# Patient Record
Sex: Female | Born: 1950 | Race: White | Hispanic: No | Marital: Married | Smoking: Former smoker
Health system: Southern US, Community
[De-identification: ages and names within clinical notes are randomized; demographics above are authoritative.]

## PROBLEM LIST (undated history)

## (undated) DIAGNOSIS — E785 Hyperlipidemia, unspecified: Secondary | ICD-10-CM

## (undated) DIAGNOSIS — B019 Varicella without complication: Secondary | ICD-10-CM

## (undated) DIAGNOSIS — Z8619 Personal history of other infectious and parasitic diseases: Secondary | ICD-10-CM

## (undated) HISTORY — DX: Varicella without complication: B01.9

## (undated) HISTORY — DX: Hyperlipidemia, unspecified: E78.5

## (undated) HISTORY — PX: BREAST EXCISIONAL BIOPSY: SUR124

---

## 1982-07-18 HISTORY — PX: OTHER SURGICAL HISTORY: SHX169

## 2015-04-10 ENCOUNTER — Other Ambulatory Visit: Payer: Self-pay | Admitting: Family Medicine

## 2015-04-10 DIAGNOSIS — R928 Other abnormal and inconclusive findings on diagnostic imaging of breast: Secondary | ICD-10-CM

## 2015-04-15 ENCOUNTER — Other Ambulatory Visit: Payer: Self-pay | Admitting: Family Medicine

## 2015-04-15 DIAGNOSIS — R928 Other abnormal and inconclusive findings on diagnostic imaging of breast: Secondary | ICD-10-CM

## 2015-06-08 ENCOUNTER — Ambulatory Visit
Admission: RE | Admit: 2015-06-08 | Discharge: 2015-06-08 | Disposition: A | Discharge status: Home / Self Care | Payer: BLUE CROSS/BLUE SHIELD | Source: Ambulatory Visit | Attending: Family Medicine | Admitting: Family Medicine

## 2015-06-08 DIAGNOSIS — R928 Other abnormal and inconclusive findings on diagnostic imaging of breast: Secondary | ICD-10-CM

## 2015-09-28 ENCOUNTER — Ambulatory Visit: Payer: BLUE CROSS/BLUE SHIELD | Admitting: Family Medicine

## 2015-10-05 ENCOUNTER — Ambulatory Visit (INDEPENDENT_AMBULATORY_CARE_PROVIDER_SITE_OTHER): Payer: BLUE CROSS/BLUE SHIELD | Admitting: Family Medicine

## 2015-10-05 NOTE — Progress Notes (Signed)
Pre visit review using our clinic review tool, if applicable. No additional management support is needed unless otherwise documented below in the visit note. 

## 2015-10-06 NOTE — Progress Notes (Signed)
Patient ID: Alicia CarneAlberta Hancock, female   DOB: 10/23/1950, 65 y.o.   MRN: [id number] Opened in error.

## 2015-11-30 ENCOUNTER — Ambulatory Visit (INDEPENDENT_AMBULATORY_CARE_PROVIDER_SITE_OTHER): Payer: BLUE CROSS/BLUE SHIELD | Admitting: Family Medicine

## 2015-11-30 ENCOUNTER — Encounter: Payer: Self-pay | Admitting: Family Medicine

## 2015-11-30 VITALS — BP 124/76 | HR 75 | Temp 98.1°F | Ht 64.0 in | Wt 131.0 lb

## 2015-11-30 DIAGNOSIS — Z0000 Encounter for general adult medical examination without abnormal findings: Secondary | ICD-10-CM | POA: Diagnosis not present

## 2015-11-30 DIAGNOSIS — Z1159 Encounter for screening for other viral diseases: Secondary | ICD-10-CM | POA: Diagnosis not present

## 2015-11-30 DIAGNOSIS — Z1239 Encounter for other screening for malignant neoplasm of breast: Secondary | ICD-10-CM

## 2015-11-30 DIAGNOSIS — Z13220 Encounter for screening for lipoid disorders: Secondary | ICD-10-CM

## 2015-11-30 DIAGNOSIS — Z1211 Encounter for screening for malignant neoplasm of colon: Secondary | ICD-10-CM | POA: Diagnosis not present

## 2015-11-30 DIAGNOSIS — Z79890 Hormone replacement therapy (postmenopausal): Secondary | ICD-10-CM

## 2015-11-30 LAB — COMPREHENSIVE METABOLIC PANEL
ALT: 16 U/L (ref 0–35)
AST: 18 U/L (ref 0–37)
Albumin: 4.4 g/dL (ref 3.5–5.2)
Alkaline Phosphatase: 55 U/L (ref 39–117)
BUN: 13 mg/dL (ref 6–23)
CALCIUM: 9.3 mg/dL (ref 8.4–10.5)
CHLORIDE: 105 meq/L (ref 96–112)
CO2: 28 meq/L (ref 19–32)
Creatinine, Ser: 0.65 mg/dL (ref 0.40–1.20)
GFR: 97.27 mL/min (ref >60.00)
Glucose, Bld: 94 mg/dL (ref 70–99)
POTASSIUM: 4.4 meq/L (ref 3.5–5.1)
Sodium: 140 mEq/L (ref 135–145)
Total Bilirubin: 0.5 mg/dL (ref 0.2–1.2)
Total Protein: 6.6 g/dL (ref 6.0–8.3)

## 2015-11-30 LAB — LIPID PANEL
CHOLESTEROL: 293 mg/dL — AB (ref 0–200)
HDL: 60.2 mg/dL (ref >39.00)
NonHDL: 232.57
TRIGLYCERIDES: 209 mg/dL — AB (ref 0.0–149.0)
Total CHOL/HDL Ratio: 5
VLDL: 41.8 mg/dL — ABNORMAL HIGH (ref 0.0–40.0)

## 2015-11-30 LAB — CBC
HCT: 35.9 % — ABNORMAL LOW (ref 36.0–46.0)
HEMOGLOBIN: 12.3 g/dL (ref 12.0–15.0)
MCHC: 34.4 g/dL (ref 30.0–36.0)
MCV: 89.4 fl (ref 78.0–100.0)
PLATELETS: 175 10*3/uL (ref 150.0–400.0)
RBC: 4.01 Mil/uL (ref 3.87–5.11)
RDW: 13 % (ref 11.5–15.5)
WBC: 3.6 10*3/uL — ABNORMAL LOW (ref 4.0–10.5)

## 2015-11-30 LAB — LDL CHOLESTEROL, DIRECT: LDL DIRECT: 179 mg/dL

## 2015-11-30 LAB — TSH: TSH: 1.88 u[IU]/mL (ref 0.35–4.50)

## 2015-11-30 MED ORDER — ESTRADIOL 1 MG PO TABS: 1.0000 mg | ORAL_TABLET | Freq: Every day | ORAL | Status: DC

## 2015-11-30 NOTE — Assessment & Plan Note (Signed)
Overall doing well. Discussed diet and exercise. Up-to-date on vaccinations. We will set up with GI for colonoscopy. Mammogram and ultrasound ordered. No need for Pap smear given hysterectomy with cervix removal for noncancerous reasons. We will screen for hepatitis C. Lab work as outlined below as well.

## 2015-11-30 NOTE — Progress Notes (Signed)
Patient ID: Alicia Hancock, female   DOB: 01-16-1951, 65 y.o.   MRN: [id number]  Tommi Rumps, MD Phone: [phone]  Alicia Hancock is a 65 y.o. female who presents today for physical exam.  Patient reports overall she is doing quite well. No chronic medical issues. Has had borderline cholesterol in the past. Exercises by golfing. She is also going to start doing some exercise at the gym. Diet consists of mostly of lean white meats and vegetables. Tdap 5 years ago. Zostavax was last year. Has never had a colonoscopy. Is due for a mammogram. Has been getting them every 6 months due to dense breasts and prior cyst removal. Last Pap smear was about 3 years ago. She is status post hysterectomy with removal of the cervix due to fibroids. Drinks a glass of wine every other day. No illicit drug use. No tobacco use. Has never had hepatitis C or HIV screening. She does take estradiol for for hormone replacement therapy. She denies hot flashes and vaginal dryness. No history of blood clots. She's been on this since she had her hysterectomy in the 80s.  Active Ambulatory Problems    Diagnosis Date Noted  . Routine general medical examination at a health care facility 11/30/2015  . Hormone replacement therapy 11/30/2015   Resolved Ambulatory Problems    Diagnosis Date Noted  . No Resolved Ambulatory Problems   Past Medical History  Diagnosis Date  . Chickenpox   . Hyperlipidemia     Family History  Problem Relation Age of Onset  . Prostate cancer    . Hyperlipidemia    . Stroke      Social History   Social History  . Marital Status: Married    Spouse Name: N/A  . Number of Children: N/A  . Years of Education: N/A   Occupational History  . Not on file.   Social History Main Topics  . Smoking status: Former Research scientist (life sciences)  . Smokeless tobacco: Not on file  . Alcohol Use: 1.8 oz/week    3 Standard drinks or equivalent per week     Comment: WINE  . Drug Use: No  . Sexual  Activity: Not on file   Other Topics Concern  . Not on file   Social History Narrative  . No narrative on file    ROS  General:  Negative for nexplained weight loss, fever Skin: Negative for new or changing mole, sore that won't heal HEENT: Negative for trouble hearing, trouble seeing, ringing in ears, mouth sores, hoarseness, change in voice, dysphagia. CV:  Negative for chest pain, dyspnea, edema, palpitations Resp: Negative for cough, dyspnea, hemoptysis GI: Negative for nausea, vomiting, diarrhea, constipation, abdominal pain, melena, hematochezia. GU: Negative for dysuria, incontinence, urinary hesitance, hematuria, vaginal or penile discharge, polyuria, sexual difficulty, lumps in testicle or breasts MSK: Negative for muscle cramps or aches, joint pain or swelling Neuro: Negative for headaches, weakness, numbness, dizziness, passing out/fainting Psych: Negative for depression, anxiety, memory problems  Objective  Physical Exam Filed Vitals:   11/30/15 0917  BP: 124/76  Pulse: 75  Temp: 98.1 F (36.7 C)    BP Readings from Last 3 Encounters:  11/30/15 124/76   Wt Readings from Last 3 Encounters:  11/30/15 131 lb (59.421 kg)    Physical Exam  Constitutional: She is well-developed, well-nourished, and in no distress.  HENT:  Head: Normocephalic and atraumatic.  Right Ear: External ear normal.  Left Ear: External ear normal.  Mouth/Throat: Oropharynx is clear and moist. No oropharyngeal  exudate.  Eyes: Conjunctivae are normal. Pupils are equal, round, and reactive to light.  Neck: Neck supple.  Cardiovascular: Normal rate, regular rhythm and normal heart sounds.  Exam reveals no gallop and no friction rub.   No murmur heard. Pulmonary/Chest: Effort normal and breath sounds normal.  Abdominal: Soft. Bowel sounds are normal. She exhibits no distension. There is no tenderness. There is no rebound and no guarding.  Musculoskeletal: She exhibits no edema.    Lymphadenopathy:    She has no cervical adenopathy.  Neurological: She is alert. Gait normal.  Skin: Skin is warm and dry. She is not diaphoretic.  Psychiatric: Mood and affect normal.     Assessment/Plan:   Routine general medical examination at a health care facility Overall doing well. Discussed diet and exercise. Up-to-date on vaccinations. We will set up with GI for colonoscopy. Mammogram and ultrasound ordered. No need for Pap smear given hysterectomy with cervix removal for noncancerous reasons. We will screen for hepatitis C. Lab work as outlined below as well.  Hormone replacement therapy Patient is surgically postmenopausal. No menopausal symptoms given that she is on hormone replacement. Discussed risks of this medication. She acknowledges these. Refill was given.    Orders Placed This Encounter  Procedures  . MM Digital Screening    Standing Status: Future     Number of Occurrences:      Standing Expiration Date: 01/29/2017    Order Specific Question:  Reason for Exam (SYMPTOM  OR DIAGNOSIS REQUIRED)    Answer:  breast cancer screening    Order Specific Question:  Preferred imaging location?    Answer:  [facility]  . US BREAST COMPLETE UNI RIGHT INC AXILLA    Standing Status: Future     Number of Occurrences:      Standing Expiration Date: 01/29/2017    Order Specific Question:  Reason for Exam (SYMPTOM  OR DIAGNOSIS REQUIRED)    Answer:  dense breasts, breast cancer screening    Order Specific Question:  Preferred imaging location?    Answer:  [facility]  . US BREAST COMPLETE UNI LEFT INC AXILLA    Standing Status: Future     Number of Occurrences:      Standing Expiration Date: 01/29/2017    Order Specific Question:  Reason for Exam (SYMPTOM  OR DIAGNOSIS REQUIRED)    Answer:  dense breasts, breast cancer screening    Order Specific Question:  Preferred imaging location?    Answer:  [facility]  . Lipid Profile  . CBC  . Comp Met (CMET)   . Hepatitis C Antibody  . TSH  . Ambulatory referral to Gastroenterology    Referral Priority:  Routine    Referral Type:  Consultation    Referral Reason:  Specialty Services Required    Number of Visits Requested:  1    Meds ordered this encounter  Medications  . DISCONTD: estradiol (ESTRACE) 1 MG tablet    Sig: Take 1 mg by mouth daily.  . estradiol (ESTRACE) 1 MG tablet    Sig: Take 1 tablet (1 mg total) by mouth daily.    Dispense:  90 tablet    Refill:  2     Eric Sonnenberg, MD [facility] [place]   

## 2015-11-30 NOTE — Progress Notes (Signed)
Pre visit review using our clinic review tool, if applicable. No additional management support is needed unless otherwise documented below in the visit note. 

## 2015-11-30 NOTE — Patient Instructions (Signed)
Nice to meet you. We'll check lab work today and call with the results. We will get you scheduled with GI for colonoscopy as well. Mammogram has been ordered and you will be contacted to get these scheduled. I refilled your estradiol. You can continue to take this. If you develop symptoms of menopause please let us know. Please note the risks of blood clot, stroke, and heart attack with estradiol.

## 2015-11-30 NOTE — Assessment & Plan Note (Signed)
Patient is surgically postmenopausal. No menopausal symptoms given that she is on hormone replacement. Discussed risks of this medication. She acknowledges these. Refill was given.

## 2015-12-01 ENCOUNTER — Other Ambulatory Visit: Payer: Self-pay | Admitting: Family Medicine

## 2015-12-01 ENCOUNTER — Telehealth: Payer: Self-pay | Admitting: *Deleted

## 2015-12-01 DIAGNOSIS — N63 Unspecified lump in unspecified breast: Secondary | ICD-10-CM

## 2015-12-01 LAB — HEPATITIS C ANTIBODY: HCV Ab: NEGATIVE

## 2015-12-01 NOTE — Telephone Encounter (Signed)
LM for patient to return call.

## 2015-12-01 NOTE — Telephone Encounter (Signed)
Patient returned a call in regards to her lab results She will be at [phone]

## 2015-12-02 ENCOUNTER — Other Ambulatory Visit: Payer: Self-pay | Admitting: Family Medicine

## 2015-12-02 DIAGNOSIS — D72819 Decreased white blood cell count, unspecified: Secondary | ICD-10-CM

## 2015-12-02 NOTE — Telephone Encounter (Signed)
Notified patient of lab results 

## 2015-12-03 ENCOUNTER — Telehealth: Payer: Self-pay | Admitting: Family Medicine

## 2015-12-03 NOTE — Telephone Encounter (Signed)
Ok. Thank you.

## 2015-12-03 NOTE — Telephone Encounter (Signed)
Pt called back with a question regarding her lab result. Call pt @ [phone]. Thank you!

## 2015-12-03 NOTE — Telephone Encounter (Signed)
Reviewed labs again with her and scheduled f/u labs in June. thanks

## 2015-12-17 ENCOUNTER — Ambulatory Visit
Admission: RE | Admit: 2015-12-17 | Discharge: 2015-12-17 | Disposition: A | Discharge status: Home / Self Care | Payer: BLUE CROSS/BLUE SHIELD | Source: Ambulatory Visit | Attending: Family Medicine | Admitting: Family Medicine

## 2015-12-17 DIAGNOSIS — N63 Unspecified lump in unspecified breast: Secondary | ICD-10-CM

## 2015-12-31 ENCOUNTER — Other Ambulatory Visit (INDEPENDENT_AMBULATORY_CARE_PROVIDER_SITE_OTHER): Payer: BLUE CROSS/BLUE SHIELD

## 2015-12-31 ENCOUNTER — Encounter: Payer: Self-pay | Admitting: Family Medicine

## 2015-12-31 DIAGNOSIS — D72819 Decreased white blood cell count, unspecified: Secondary | ICD-10-CM

## 2015-12-31 LAB — CBC WITH DIFFERENTIAL/PLATELET
BASOS ABS: 0 10*3/uL (ref 0.0–0.1)
BASOS PCT: 0.6 % (ref 0.0–3.0)
EOS ABS: 0.1 10*3/uL (ref 0.0–0.7)
Eosinophils Relative: 1.6 % (ref 0.0–5.0)
HEMATOCRIT: 37.2 % (ref 36.0–46.0)
Hemoglobin: 12.6 g/dL (ref 12.0–15.0)
LYMPHS ABS: 1.4 10*3/uL (ref 0.7–4.0)
LYMPHS PCT: 33.7 % (ref 12.0–46.0)
MCHC: 33.9 g/dL (ref 30.0–36.0)
MCV: 90.4 fl (ref 78.0–100.0)
Monocytes Absolute: 0.3 10*3/uL (ref 0.1–1.0)
Monocytes Relative: 6.5 % (ref 3.0–12.0)
NEUTROS ABS: 2.4 10*3/uL (ref 1.4–7.7)
NEUTROS PCT: 57.6 % (ref 43.0–77.0)
PLATELETS: 283 10*3/uL (ref 150.0–400.0)
RBC: 4.11 Mil/uL (ref 3.87–5.11)
RDW: 12.8 % (ref 11.5–15.5)
WBC: 4.2 10*3/uL (ref 4.0–10.5)

## 2016-01-04 ENCOUNTER — Other Ambulatory Visit: Payer: BLUE CROSS/BLUE SHIELD

## 2016-03-17 ENCOUNTER — Encounter: Payer: Self-pay | Admitting: *Deleted

## 2016-03-18 ENCOUNTER — Encounter
Admission: RE | Disposition: A | Discharge status: Home / Self Care | Payer: Self-pay | Source: Ambulatory Visit | Attending: Gastroenterology

## 2016-03-18 ENCOUNTER — Ambulatory Visit: Payer: Medicare Other | Admitting: Anesthesiology

## 2016-03-18 ENCOUNTER — Ambulatory Visit
Admission: RE | Admit: 2016-03-18 | Discharge: 2016-03-18 | Disposition: A | Discharge status: Home / Self Care | Payer: Medicare Other | Source: Ambulatory Visit | Attending: Gastroenterology | Admitting: Gastroenterology

## 2016-03-18 ENCOUNTER — Encounter: Payer: Self-pay | Admitting: *Deleted

## 2016-03-18 DIAGNOSIS — K644 Residual hemorrhoidal skin tags: Secondary | ICD-10-CM | POA: Insufficient documentation

## 2016-03-18 DIAGNOSIS — Z1211 Encounter for screening for malignant neoplasm of colon: Secondary | ICD-10-CM | POA: Diagnosis present

## 2016-03-18 DIAGNOSIS — E785 Hyperlipidemia, unspecified: Secondary | ICD-10-CM | POA: Insufficient documentation

## 2016-03-18 DIAGNOSIS — Q438 Other specified congenital malformations of intestine: Secondary | ICD-10-CM | POA: Diagnosis not present

## 2016-03-18 HISTORY — DX: Personal history of other infectious and parasitic diseases: Z86.19

## 2016-03-18 HISTORY — PX: COLONOSCOPY WITH PROPOFOL: SHX5780

## 2016-03-18 SURGERY — COLONOSCOPY WITH PROPOFOL
Anesthesia: General

## 2016-03-18 MED ORDER — SODIUM CHLORIDE 0.9 % IV SOLN
INTRAVENOUS | Status: DC
  Administered 2016-03-18: 13:00:00 via INTRAVENOUS

## 2016-03-18 MED ORDER — PROPOFOL 500 MG/50ML IV EMUL
INTRAVENOUS | Status: DC | PRN
  Administered 2016-03-18: 150 ug/kg/min via INTRAVENOUS

## 2016-03-18 MED ORDER — SODIUM CHLORIDE 0.9 % IV SOLN
INTRAVENOUS | Status: DC
  Administered 2016-03-18: 1000 mL via INTRAVENOUS

## 2016-03-18 MED ORDER — PROPOFOL 10 MG/ML IV BOLUS
INTRAVENOUS | Status: DC | PRN
  Administered 2016-03-18: 60 mg via INTRAVENOUS

## 2016-03-18 MED ORDER — LIDOCAINE HCL (CARDIAC) 20 MG/ML IV SOLN
INTRAVENOUS | Status: DC | PRN
  Administered 2016-03-18: 60 mg via INTRAVENOUS

## 2016-03-18 MED ORDER — PHENYLEPHRINE HCL 10 MG/ML IJ SOLN
INTRAMUSCULAR | Status: DC | PRN
  Administered 2016-03-18 (×2): 200 ug via INTRAVENOUS

## 2016-03-18 NOTE — Anesthesia Postprocedure Evaluation (Signed)
Anesthesia Post Note  Patient: Alicia Hancock  Procedure(s) Performed: Procedure(s) (LRB): COLONOSCOPY WITH PROPOFOL (N/A)  Patient location during evaluation: PACU Anesthesia Type: General Level of consciousness: awake and alert and oriented Pain management: pain level controlled Vital Signs Assessment: post-procedure vital signs reviewed and stable Respiratory status: spontaneous breathing, nonlabored ventilation and respiratory function stable Cardiovascular status: blood pressure returned to baseline and stable Postop Assessment: no signs of nausea or vomiting Anesthetic complications: no    Last Vitals:  Vitals:   03/18/16 1247 03/18/16 1420  BP: 130/76   Pulse: 93   Resp: 16   Temp: 36.6 C (!) 35.9 C    Last Pain:  Vitals:   03/18/16 1420  TempSrc: Tympanic                 Gabriana Wilmott

## 2016-03-18 NOTE — Op Note (Signed)
[facility] Patient Name: Alicia Hancock Procedure Date: 03/18/2016 1:19 PM MRN: [id number] Account #: [id number] Date of Birth: 1950/11/01 Admit Type: Outpatient Age: 65 Room: [facility] ENDO ROOM 3 Gender: Female Note Status: Finalized Procedure:            Colonoscopy Indications:          Screening for colorectal malignant neoplasm, This is                        the patient's first colonoscopy Providers:            Christena Deem, MD Referring MD:         Minerva Areola g. Birdie Sons (Referring MD) Medicines:            Monitored Anesthesia Care Complications:        No immediate complications. Procedure:            Pre-Anesthesia Assessment:                       - ASA Grade Assessment: II - A patient with mild                        systemic disease.                       After obtaining informed consent, the colonoscope was                        passed under direct vision. Throughout the procedure,                        the patient's blood pressure, pulse, and oxygen                        saturations were monitored continuously. The                        Colonoscope was introduced through the anus and                        advanced to the the cecum, identified by appendiceal                        orifice and ileocecal valve. The patient tolerated the                        procedure well. The quality of the bowel preparation                        was good. The colonoscopy was unusually difficult due                        to significant looping and a tortuous colon. Successful                        completion of the procedure was aided by changing the                        patient to a supine position, using manual pressure and  changing endoscopes. Findings:      The sigmoid colon, descending colon and transverse colon were       significantly redundant.      Non-bleeding external hemorrhoids were found during  perianal exam. The       hemorrhoids were small.      The digital rectal exam was normal. Impression:           - Redundant colon.                       - Non-bleeding external hemorrhoids.                       - No specimens collected. Recommendation:       - Discharge patient to home.                       - Use Analpram HC Cream 2.5%: Apply externally TID for                        10 days. Procedure Code(s):    --- Professional ---                       [phone], Colonoscopy, flexible; diagnostic, including                        collection of specimen(s) by brushing or washing, when                        performed (separate procedure) Diagnosis Code(s):    --- Professional ---                       Z12.11, Encounter for screening for malignant neoplasm                        of colon                       K64.4, Residual hemorrhoidal skin tags                       Q43.8, Other specified congenital malformations of                        intestine CPT copyright 2016 American Medical Association. All rights reserved. The codes documented in this report are preliminary and upon coder review may  be revised to meet current compliance requirements. Christena DeemMartin U Skulskie, MD 03/18/2016 2:19:32 PM This report has been signed electronically. Number of Addenda: 0 Note Initiated On: 03/18/2016 1:19 PM Scope Withdrawal Time: 0 hours 6 minutes 15 seconds  Total Procedure Duration: 0 hours 43 minutes 11 seconds       [facility]

## 2016-03-18 NOTE — Transfer of Care (Signed)
Immediate Anesthesia Transfer of Care Note  Patient: Alicia Hancock  Procedure(s) Performed: Procedure(s): COLONOSCOPY WITH PROPOFOL (N/A)  Patient Location: Endoscopy Unit  Anesthesia Type:General  Level of Consciousness: awake, alert , oriented and patient cooperative  Airway & Oxygen Therapy: Patient Spontanous Breathing and Patient connected to nasal cannula oxygen  Post-op Assessment: Report given to RN, Post -op Vital signs reviewed and stable and Patient moving all extremities X 4  Post vital signs: Reviewed and stable  Last Vitals:  Vitals:   03/18/16 1247  BP: 130/76  Pulse: 93  Resp: 16  Temp: 36.6 C    Last Pain:  Vitals:   03/18/16 1247  TempSrc: Tympanic         Complications: No apparent anesthesia complications

## 2016-03-18 NOTE — H&P (Signed)
Outpatient short stay form Pre-procedure 03/18/2016 1:18 PM Alicia Hancock Shriyan Arakawa MD  Primary Physician: Dr. Marikay AlarEric Sonnenberg  Reason for visit:  Screening colonoscopy  History of present illness:  Patient is a 65 year old female presenting today as above. She tolerated her prep well. She takes no aspirin or blood thinning agents.    Current Facility-Administered Medications:  .  0.9 %  sodium chloride infusion, , Intravenous, Continuous, Alicia Hancock Taisha Pennebaker, MD .  0.9 %  sodium chloride infusion, , Intravenous, Continuous, Alicia Hancock Wayland Baik, MD  Prescriptions Prior to Admission  Medication Sig Dispense Refill Last Dose  . estradiol (ESTRACE) 1 MG tablet Take 1 tablet (1 mg total) by mouth daily. 90 tablet 2 Past Week at Unknown time  . Multiple Vitamin (MULTIVITAMIN) tablet Take 1 tablet by mouth daily.   Past Week at Unknown time     No Known Allergies   Past Medical History:  Diagnosis Date  . Chickenpox   . History of chickenpox   . Hyperlipidemia     Review of systems:      Physical Exam    Heart and lungs: Regular rate and rhythm without rub or gallop, lungs are bilaterally clear.    HEENT: Normocephalic atraumatic eyes are anicteric    Other:     Pertinant exam for procedure: Soft nontender nondistended sounds positive normoactive.    Planned proceedures: Colonoscopy and indicated procedures. I have discussed the risks benefits and complications of procedures to include not limited to bleeding, infection, perforation and the risk of sedation and the patient wishes to proceed.    Alicia Hancock Germany Dodgen, MD Gastroenterology 03/18/2016  1:18 PM

## 2016-03-18 NOTE — Anesthesia Preprocedure Evaluation (Signed)
Anesthesia Evaluation  Patient identified by MRN, date of birth, ID band Patient awake    Reviewed: Allergy & Precautions, NPO status , Patient's Chart, lab work & pertinent test results  History of Anesthesia Complications Negative for: history of anesthetic complications  Airway Mallampati: II  TM Distance: >3 FB Neck ROM: Full    Dental no notable dental hx.    Pulmonary neg sleep apnea, neg COPD, former smoker,    breath sounds clear to auscultation- rhonchi (-) wheezing      Cardiovascular Exercise Tolerance: Good (-) hypertension(-) CAD and (-) Past MI  Rhythm:Regular Rate:Normal - Systolic murmurs and - Diastolic murmurs    Neuro/Psych negative neurological ROS  negative psych ROS   GI/Hepatic negative GI ROS, Neg liver ROS,   Endo/Other  negative endocrine ROSneg diabetes  Renal/GU negative Renal ROS     Musculoskeletal negative musculoskeletal ROS (+)   Abdominal (+) - obese,   Peds  Hematology negative hematology ROS (+)   Anesthesia Other Findings Past Medical History: No date: Chickenpox No date: History of chickenpox No date: Hyperlipidemia   Reproductive/Obstetrics                             Anesthesia Physical Anesthesia Plan  ASA: I  Anesthesia Plan: General   Post-op Pain Management:    Induction: Intravenous  Airway Management Planned: Natural Airway  Additional Equipment:   Intra-op Plan:   Post-operative Plan:   Informed Consent: I have reviewed the patients History and Physical, chart, labs and discussed the procedure including the risks, benefits and alternatives for the proposed anesthesia with the patient or authorized representative who has indicated his/her understanding and acceptance.   Dental advisory given  Plan Discussed with: Anesthesiologist and CRNA  Anesthesia Plan Comments:         Anesthesia Quick Evaluation

## 2016-03-22 ENCOUNTER — Encounter: Payer: Self-pay | Admitting: Gastroenterology

## 2016-09-02 ENCOUNTER — Other Ambulatory Visit: Payer: Self-pay | Admitting: Family Medicine

## 2016-09-02 NOTE — Telephone Encounter (Signed)
Last OV 11/30/15 last filled 11/30/15 90 2rf

## 2016-11-10 ENCOUNTER — Other Ambulatory Visit: Payer: Self-pay | Admitting: Family Medicine

## 2016-11-10 DIAGNOSIS — Z1231 Encounter for screening mammogram for malignant neoplasm of breast: Secondary | ICD-10-CM

## 2016-11-30 ENCOUNTER — Encounter: Payer: Self-pay | Admitting: Family Medicine

## 2016-11-30 ENCOUNTER — Ambulatory Visit (INDEPENDENT_AMBULATORY_CARE_PROVIDER_SITE_OTHER): Payer: Medicare Other | Admitting: Family Medicine

## 2016-11-30 VITALS — BP 120/66 | HR 73 | Temp 98.1°F | Ht 64.0 in | Wt 130.0 lb

## 2016-11-30 DIAGNOSIS — Z130 Encounter for screening for diseases of the blood and blood-forming organs and certain disorders involving the immune mechanism: Secondary | ICD-10-CM

## 2016-11-30 DIAGNOSIS — Z13220 Encounter for screening for lipoid disorders: Secondary | ICD-10-CM

## 2016-11-30 DIAGNOSIS — E785 Hyperlipidemia, unspecified: Secondary | ICD-10-CM | POA: Diagnosis not present

## 2016-11-30 DIAGNOSIS — Z1329 Encounter for screening for other suspected endocrine disorder: Secondary | ICD-10-CM

## 2016-11-30 DIAGNOSIS — D72819 Decreased white blood cell count, unspecified: Secondary | ICD-10-CM

## 2016-11-30 DIAGNOSIS — Z79890 Hormone replacement therapy (postmenopausal): Secondary | ICD-10-CM | POA: Diagnosis not present

## 2016-11-30 DIAGNOSIS — Z0000 Encounter for general adult medical examination without abnormal findings: Secondary | ICD-10-CM

## 2016-11-30 DIAGNOSIS — Z780 Asymptomatic menopausal state: Secondary | ICD-10-CM

## 2016-11-30 LAB — LIPID PANEL
Cholesterol: 286 mg/dL — ABNORMAL HIGH (ref 0–200)
HDL: 78 mg/dL (ref >39.00)
NONHDL: 207.61
Total CHOL/HDL Ratio: 4
Triglycerides: 213 mg/dL — ABNORMAL HIGH (ref 0.0–149.0)
VLDL: 42.6 mg/dL — ABNORMAL HIGH (ref 0.0–40.0)

## 2016-11-30 LAB — COMPREHENSIVE METABOLIC PANEL
ALBUMIN: 4.4 g/dL (ref 3.5–5.2)
ALT: 11 U/L (ref 0–35)
AST: 15 U/L (ref 0–37)
Alkaline Phosphatase: 52 U/L (ref 39–117)
BUN: 14 mg/dL (ref 6–23)
CHLORIDE: 104 meq/L (ref 96–112)
CO2: 29 mEq/L (ref 19–32)
CREATININE: 0.69 mg/dL (ref 0.40–1.20)
Calcium: 9.2 mg/dL (ref 8.4–10.5)
GFR: 90.51 mL/min (ref >60.00)
GLUCOSE: 97 mg/dL (ref 70–99)
Potassium: 4.2 mEq/L (ref 3.5–5.1)
SODIUM: 138 meq/L (ref 135–145)
Total Bilirubin: 0.4 mg/dL (ref 0.2–1.2)
Total Protein: 6.9 g/dL (ref 6.0–8.3)

## 2016-11-30 LAB — CBC
HEMATOCRIT: 39.5 % (ref 36.0–46.0)
HEMOGLOBIN: 13.5 g/dL (ref 12.0–15.0)
MCHC: 34.2 g/dL (ref 30.0–36.0)
MCV: 90.8 fl (ref 78.0–100.0)
Platelets: 266 10*3/uL (ref 150.0–400.0)
RBC: 4.35 Mil/uL (ref 3.87–5.11)
RDW: 12.4 % (ref 11.5–15.5)
WBC: 4.8 10*3/uL (ref 4.0–10.5)

## 2016-11-30 LAB — LDL CHOLESTEROL, DIRECT: LDL DIRECT: 167 mg/dL

## 2016-11-30 MED ORDER — PNEUMOCOCCAL 13-VAL CONJ VACC IM SUSP: 0.5000 mL | INTRAMUSCULAR | 0 refills | Status: AC

## 2016-11-30 MED ORDER — ESTRADIOL 1 MG PO TABS: 1.0000 mg | ORAL_TABLET | Freq: Every day | ORAL | 1 refills | Status: DC

## 2016-11-30 MED ORDER — TETANUS-DIPHTH-ACELL PERTUSSIS 5-2.5-18.5 LF-MCG/0.5 IM SUSP: 0.5000 mL | Freq: Once | INTRAMUSCULAR | 0 refills | Status: AC

## 2016-11-30 NOTE — Patient Instructions (Signed)
Nice to see you. Please continue to work on diet and exercise to help with your cholesterol. Please have the mammogram done as planned. Please get the pneumonia vaccine and a tetanus vaccination. We will contact you with your lab work. We will get a bone density test set up as well.

## 2016-11-30 NOTE — Assessment & Plan Note (Signed)
Physical exam completed. Encouraged continued diet and exercise. We'll check labs as outlined below. Mammogram rescheduled. Up-to-date on colonoscopy. Pap smear or pelvic exam deferred given prior history of hysterectomy and BSO. She was provided with Prevnar and tetanus vaccination prescriptions. DEXA scan was ordered. Lipid panel checked given prior elevated cholesterol. She does note she would be hesitant to start on medication for this.

## 2016-11-30 NOTE — Assessment & Plan Note (Signed)
Surgically postmenopausal. Has been on hormone replacement for some time. Discussed risks of this medication. She acknowledges these. Refills given.

## 2016-11-30 NOTE — Progress Notes (Signed)
Tommi Rumps, MD Phone: [phone]  Alicia Hancock is a 66 y.o. female who presents today for physical exam.  Exercises by walking while playing golf. Tries to moderate what she eats. She does eat anything she wants. Mostly drinks water. Due for mammogram next month. Colonoscopy up-to-date. History of hysterectomy and BSO for noncancerous reasons. No Pap smear needed. Due for pneumonia vaccine and tetanus vaccination. Due for DEXA scan. One alcoholic beverage daily. Quit smoking 36 years ago. No illicit drug use. Does have hyperlipidemia. This is familial. Has declined medication previously. No chest pain or shortness of breath. She does take hormone supplementation given that she had her ovaries removed many years ago. She notes no postmenopausal symptoms. She does not want to discontinue this.   Active Ambulatory Problems    Diagnosis Date Noted  . Routine general medical examination at a health care facility 11/30/2015  . Hormone replacement therapy 11/30/2015   Resolved Ambulatory Problems    Diagnosis Date Noted  . No Resolved Ambulatory Problems   Past Medical History:  Diagnosis Date  . Chickenpox   . History of chickenpox   . Hyperlipidemia     Family History  Problem Relation Age of Onset  . Prostate cancer Unknown   . Hyperlipidemia Unknown   . Stroke Unknown     Social History   Social History  . Marital status: Married    Spouse name: N/A  . Number of children: N/A  . Years of education: N/A   Occupational History  . Not on file.   Social History Main Topics  . Smoking status: Former Research scientist (life sciences)  . Smokeless tobacco: Never Used  . Alcohol use 1.8 oz/week    3 Standard drinks or equivalent per week     Comment: WINE  . Drug use: No  . Sexual activity: Not on file   Other Topics Concern  . Not on file   Social History Narrative  . No narrative on file    ROS  General:  Negative for nexplained weight loss, fever Skin: Negative for new  or changing mole, sore that won't heal HEENT: Negative for trouble hearing, trouble seeing, ringing in ears, mouth sores, hoarseness, change in voice, dysphagia. CV:  Negative for chest pain, dyspnea, edema, palpitations Resp: Negative for cough, dyspnea, hemoptysis GI: Negative for nausea, vomiting, diarrhea, constipation, abdominal pain, melena, hematochezia. GU: Negative for dysuria, incontinence, urinary hesitance, hematuria, vaginal or penile discharge, polyuria, sexual difficulty, lumps in testicle or breasts MSK: Negative for muscle cramps or aches, joint pain or swelling Neuro: Negative for headaches, weakness, numbness, dizziness, passing out/fainting Psych: Negative for depression, anxiety, memory problems  Objective  Physical Exam Vitals:   11/30/16 0959  BP: 120/66  Pulse: 73  Temp: 98.1 F (36.7 C)    BP Readings from Last 3 Encounters:  11/30/16 120/66  03/18/16 130/76  11/30/15 124/76   Wt Readings from Last 3 Encounters:  11/30/16 130 lb (59 kg)  03/18/16 128 lb (58.1 kg)  11/30/15 131 lb (59.4 kg)    Physical Exam  Constitutional: No distress.  HENT:  Head: Normocephalic and atraumatic.  Mouth/Throat: Oropharynx is clear and moist. No oropharyngeal exudate.  Eyes: Conjunctivae are normal. Pupils are equal, round, and reactive to light.  Cardiovascular: Normal rate, regular rhythm and normal heart sounds.   Pulmonary/Chest: Effort normal and breath sounds normal.  Abdominal: Soft. Bowel sounds are normal. She exhibits no distension. There is no tenderness. There is no rebound and no guarding.  Genitourinary:  Genitourinary Comments: Pelvic exam deferred given she is status post hysterectomy, breast exam with no masses, skin changes, or nipple inversion, no axillary masses bilaterally  Musculoskeletal: She exhibits no edema.  Neurological: She is alert. Gait normal.  Skin: Skin is warm and dry. She is not diaphoretic.  Psychiatric: Mood and affect normal.       Assessment/Plan:   Routine general medical examination at a health care facility Physical exam completed. Encouraged continued diet and exercise. We'll check labs as outlined below. Mammogram rescheduled. Up-to-date on colonoscopy. Pap smear or pelvic exam deferred given prior history of hysterectomy and BSO. She was provided with Prevnar and tetanus vaccination prescriptions. DEXA scan was ordered. Lipid panel checked given prior elevated cholesterol. She does note she would be hesitant to start on medication for this.  Hormone replacement therapy Surgically postmenopausal. Has been on hormone replacement for some time. Discussed risks of this medication. She acknowledges these. Refills given.   Orders Placed This Encounter  Procedures  . DG Bone Density    Standing Status:   Future    Standing Expiration Date:   01/30/2018    Order Specific Question:   Reason for Exam (SYMPTOM  OR DIAGNOSIS REQUIRED)    Answer:   post menopausal hormone deficiency, osteoporosis screening    Order Specific Question:   Preferred imaging location?    Answer:   [facility]  . Lipid panel  . Comp Met (CMET)  . CBC    Meds ordered this encounter  Medications  . estradiol (ESTRACE) 1 MG tablet    Sig: Take 1 tablet (1 mg total) by mouth daily.    Dispense:  90 tablet    Refill:  1  . pneumococcal 13-valent conjugate vaccine (PREVNAR 13) SUSP injection    Sig: Inject 0.5 mLs into the muscle tomorrow at 10 am.    Dispense:  1 Syringe    Refill:  0  . Tdap (BOOSTRIX) 5-2.5-18.5 LF-MCG/0.5 injection    Sig: Inject 0.5 mLs into the muscle once.    Dispense:  0.5 mL    Refill:  0     Tommi Rumps, MD [employer]

## 2016-12-01 ENCOUNTER — Telehealth: Payer: Self-pay

## 2016-12-01 NOTE — Telephone Encounter (Signed)
Patient notified and will think about starting cholesterol medication

## 2016-12-01 NOTE — Telephone Encounter (Signed)
-----   Message from Glori LuisEric G Sonnenberg, MD sent at 11/30/2016  4:38 PM EDT ----- Please let the patient know that her cholesterol is still quite elevated. She would benefit from medication such as crestor for this. She has been hesitant to take medication and if not willing to start it now she will need to work on diet and exercise further and may need to see a nutritionist. Her other labs are acceptable. Thanks.

## 2016-12-01 NOTE — Telephone Encounter (Signed)
Left message to return call 

## 2016-12-19 ENCOUNTER — Ambulatory Visit
Admission: RE | Admit: 2016-12-19 | Discharge: 2016-12-19 | Disposition: A | Discharge status: Home / Self Care | Payer: Medicare Other | Source: Ambulatory Visit | Attending: Family Medicine | Admitting: Family Medicine

## 2016-12-19 DIAGNOSIS — Z1231 Encounter for screening mammogram for malignant neoplasm of breast: Secondary | ICD-10-CM

## 2017-11-13 ENCOUNTER — Other Ambulatory Visit: Payer: Self-pay | Admitting: Family Medicine

## 2017-12-19 ENCOUNTER — Other Ambulatory Visit: Payer: Self-pay | Admitting: Internal Medicine

## 2017-12-22 ENCOUNTER — Other Ambulatory Visit: Payer: Self-pay | Admitting: Internal Medicine

## 2017-12-22 DIAGNOSIS — Z1231 Encounter for screening mammogram for malignant neoplasm of breast: Secondary | ICD-10-CM

## 2018-01-10 ENCOUNTER — Ambulatory Visit
Admission: RE | Admit: 2018-01-10 | Discharge: 2018-01-10 | Disposition: A | Discharge status: Home / Self Care | Payer: Medicare Other | Source: Ambulatory Visit | Attending: Internal Medicine | Admitting: Internal Medicine

## 2018-01-10 DIAGNOSIS — Z1231 Encounter for screening mammogram for malignant neoplasm of breast: Secondary | ICD-10-CM | POA: Diagnosis not present

## 2018-12-26 ENCOUNTER — Other Ambulatory Visit: Payer: Self-pay | Admitting: Internal Medicine

## 2018-12-26 DIAGNOSIS — Z1231 Encounter for screening mammogram for malignant neoplasm of breast: Secondary | ICD-10-CM

## 2019-01-14 ENCOUNTER — Ambulatory Visit
Admission: RE | Admit: 2019-01-14 | Discharge: 2019-01-14 | Disposition: A | Discharge status: Home / Self Care | Payer: Medicare Other | Source: Ambulatory Visit | Attending: Internal Medicine | Admitting: Internal Medicine

## 2019-01-14 ENCOUNTER — Other Ambulatory Visit: Payer: Self-pay

## 2019-01-14 DIAGNOSIS — Z1231 Encounter for screening mammogram for malignant neoplasm of breast: Secondary | ICD-10-CM | POA: Insufficient documentation

## 2019-12-18 ENCOUNTER — Other Ambulatory Visit: Payer: Self-pay | Admitting: Internal Medicine

## 2019-12-18 DIAGNOSIS — Z1231 Encounter for screening mammogram for malignant neoplasm of breast: Secondary | ICD-10-CM

## 2020-01-15 ENCOUNTER — Ambulatory Visit
Admission: RE | Admit: 2020-01-15 | Discharge: 2020-01-15 | Disposition: A | Discharge status: Home / Self Care | Payer: Medicare Other | Source: Ambulatory Visit | Attending: Internal Medicine | Admitting: Internal Medicine

## 2020-01-15 DIAGNOSIS — Z1231 Encounter for screening mammogram for malignant neoplasm of breast: Secondary | ICD-10-CM | POA: Diagnosis present

## 2021-01-11 ENCOUNTER — Other Ambulatory Visit: Payer: Self-pay | Admitting: Internal Medicine

## 2021-01-11 DIAGNOSIS — Z1231 Encounter for screening mammogram for malignant neoplasm of breast: Secondary | ICD-10-CM

## 2021-01-25 ENCOUNTER — Ambulatory Visit
Admission: RE | Admit: 2021-01-25 | Discharge: 2021-01-25 | Disposition: A | Discharge status: Home / Self Care | Payer: Medicare Other | Source: Ambulatory Visit | Attending: Internal Medicine | Admitting: Internal Medicine

## 2021-01-25 ENCOUNTER — Other Ambulatory Visit: Payer: Self-pay

## 2021-01-25 DIAGNOSIS — Z1231 Encounter for screening mammogram for malignant neoplasm of breast: Secondary | ICD-10-CM | POA: Insufficient documentation

## 2021-02-01 ENCOUNTER — Other Ambulatory Visit: Payer: Self-pay | Admitting: Internal Medicine

## 2021-02-01 DIAGNOSIS — R921 Mammographic calcification found on diagnostic imaging of breast: Secondary | ICD-10-CM

## 2021-02-01 DIAGNOSIS — R928 Other abnormal and inconclusive findings on diagnostic imaging of breast: Secondary | ICD-10-CM

## 2021-02-03 ENCOUNTER — Other Ambulatory Visit: Payer: Self-pay

## 2021-02-03 ENCOUNTER — Ambulatory Visit
Admission: RE | Admit: 2021-02-03 | Discharge: 2021-02-03 | Disposition: A | Discharge status: Home / Self Care | Payer: Medicare Other | Source: Ambulatory Visit | Attending: Internal Medicine | Admitting: Internal Medicine

## 2021-02-03 DIAGNOSIS — R928 Other abnormal and inconclusive findings on diagnostic imaging of breast: Secondary | ICD-10-CM | POA: Diagnosis not present

## 2021-02-03 DIAGNOSIS — R921 Mammographic calcification found on diagnostic imaging of breast: Secondary | ICD-10-CM

## 2021-02-08 ENCOUNTER — Other Ambulatory Visit: Payer: Self-pay | Admitting: Internal Medicine

## 2021-02-08 DIAGNOSIS — R921 Mammographic calcification found on diagnostic imaging of breast: Secondary | ICD-10-CM

## 2021-02-08 DIAGNOSIS — R928 Other abnormal and inconclusive findings on diagnostic imaging of breast: Secondary | ICD-10-CM

## 2021-02-11 ENCOUNTER — Other Ambulatory Visit: Payer: Self-pay

## 2021-02-11 ENCOUNTER — Ambulatory Visit
Admission: RE | Admit: 2021-02-11 | Discharge: 2021-02-11 | Disposition: A | Discharge status: Home / Self Care | Payer: Medicare Other | Source: Ambulatory Visit | Attending: Internal Medicine | Admitting: Internal Medicine

## 2021-02-11 DIAGNOSIS — R921 Mammographic calcification found on diagnostic imaging of breast: Secondary | ICD-10-CM | POA: Insufficient documentation

## 2021-02-11 DIAGNOSIS — R928 Other abnormal and inconclusive findings on diagnostic imaging of breast: Secondary | ICD-10-CM | POA: Diagnosis present

## 2021-02-11 HISTORY — PX: BREAST BIOPSY: SHX20

## 2021-02-12 LAB — SURGICAL PATHOLOGY

## 2021-02-15 ENCOUNTER — Encounter: Payer: Self-pay | Admitting: *Deleted

## 2021-02-15 NOTE — Progress Notes (Signed)
Received message from Alicia Lynn, RN that patient had been informed of her new diagnosis of breast cancer and is ready for navigation to call her.   Called patient, but no answer.  I have left her a message to return my call.  I however see in Epic that patient has an appointment with [facility] on 03/04/21.  Patient and her husband Alicia Hancock, returned my call.  They had multiple questions.  Answered questions.  Patient may want multiple surgical opinions.  Names given to surgeons at [facility].  Informed patient I did not know all the surgeons at [facility].  They are going to look them up.  Patient and her husband to discuss and get back to me if they want assistance with coordination of care.

## 2022-04-27 IMAGING — MG MM BREAST LOCALIZATION CLIP
4 series · 4 of 12 positions shown · non-contrast
Comparison: Previous exam(s).

CLINICAL DATA: Patient status post stereotactic guided biopsy left
breast calcifications.

EXAM:
3D DIAGNOSTIC LEFT MAMMOGRAM POST STEREOTACTIC BIOPSY

[L CC synth-2D]
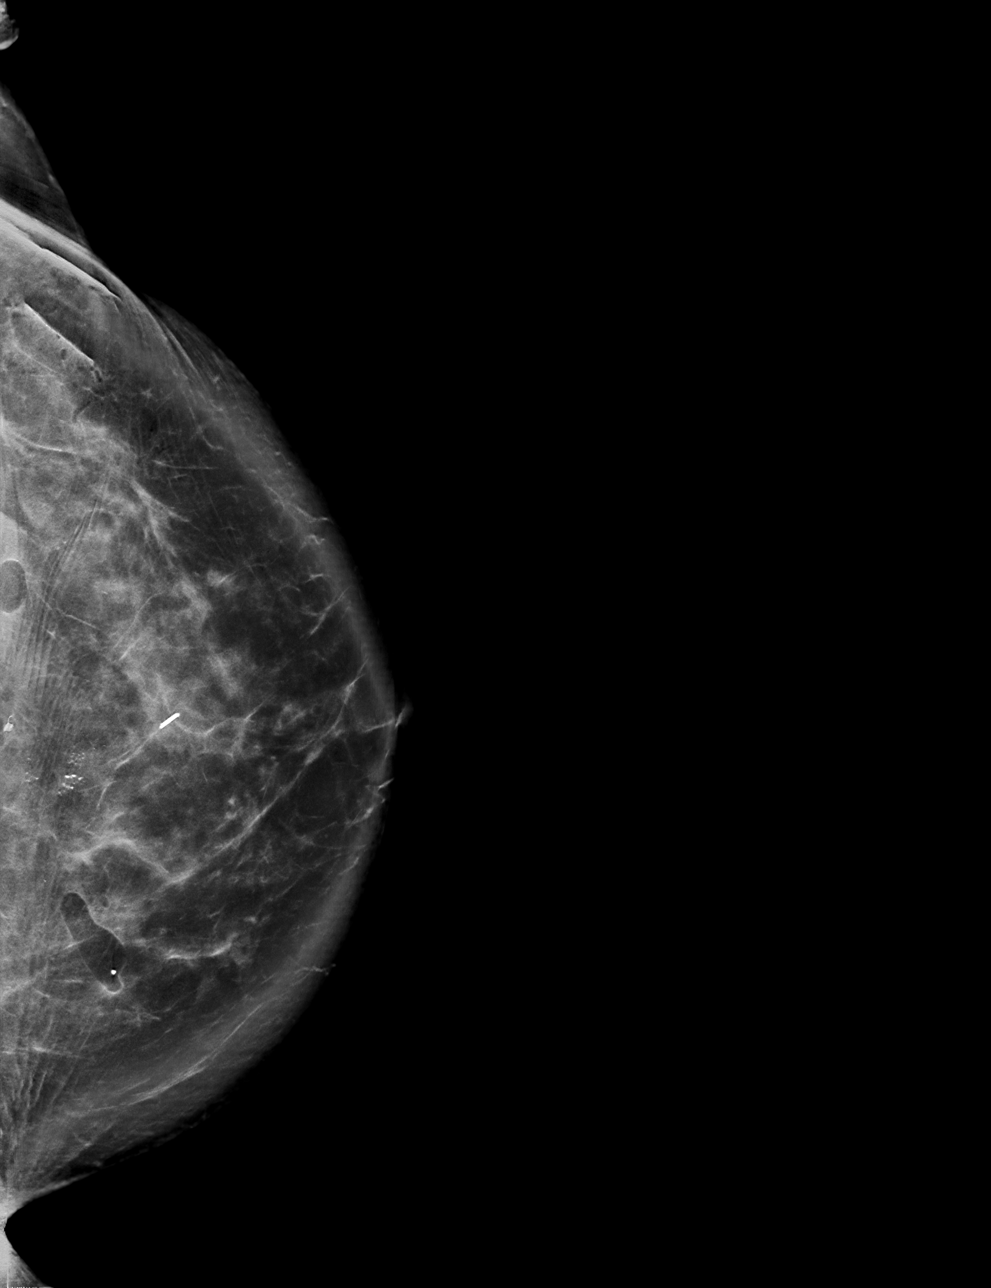

[L LM synth-2D]
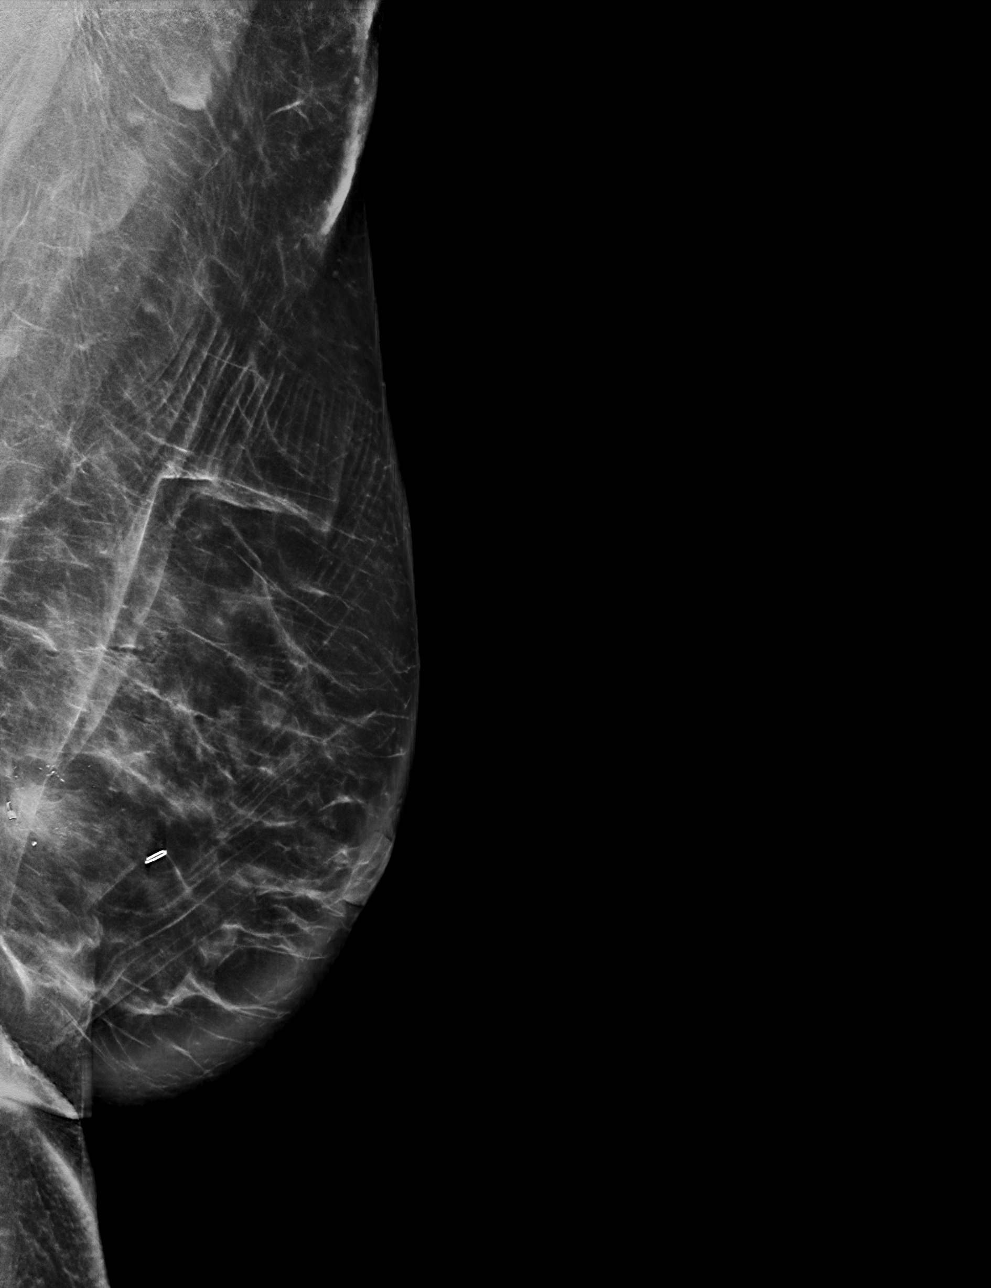

[L CC tomo · tomo slice 58/115.0]
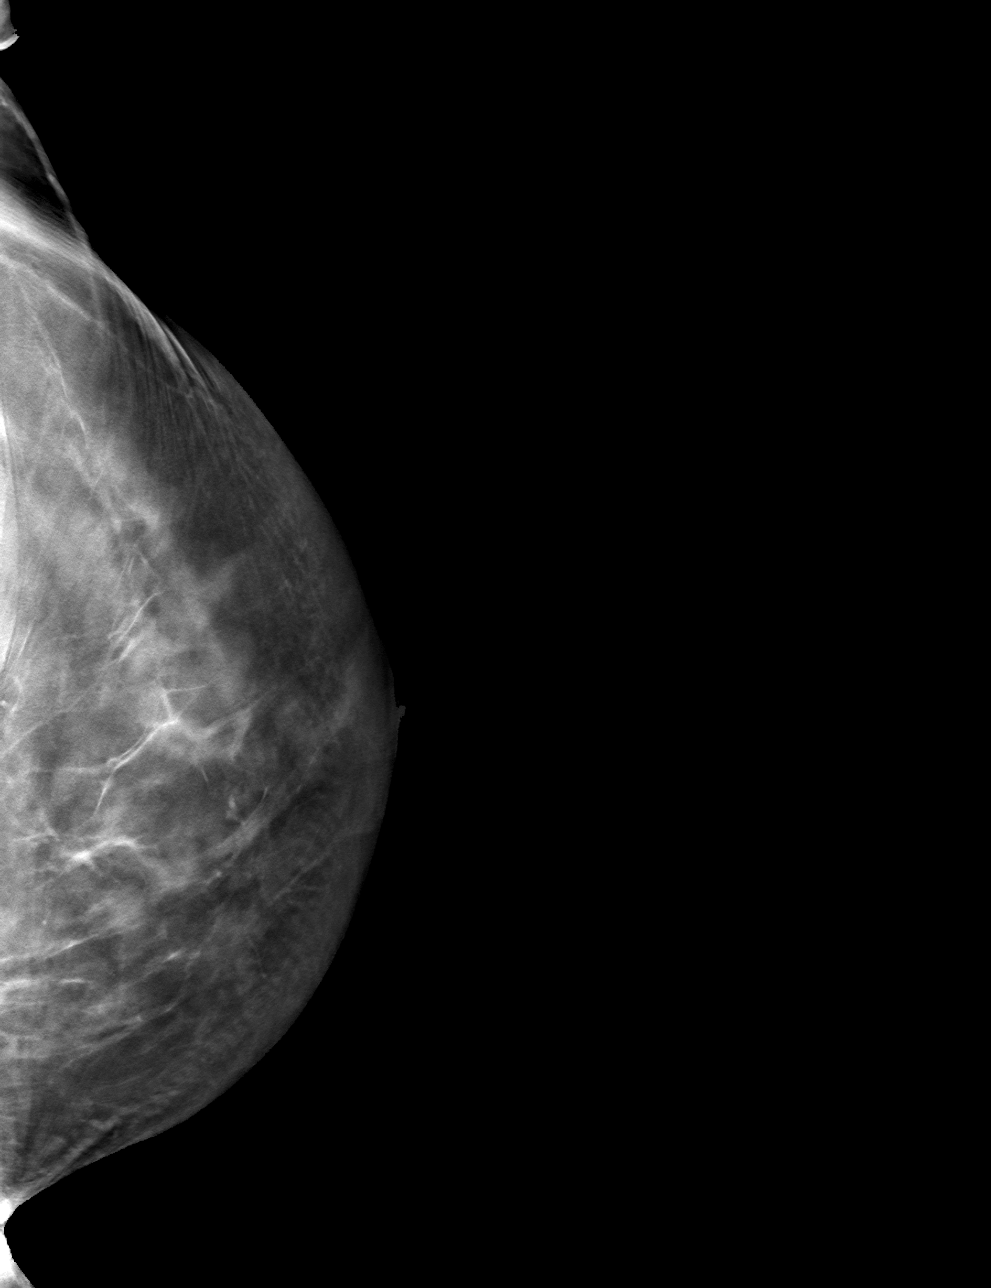

[L LM tomo · tomo slice 43/85.0]
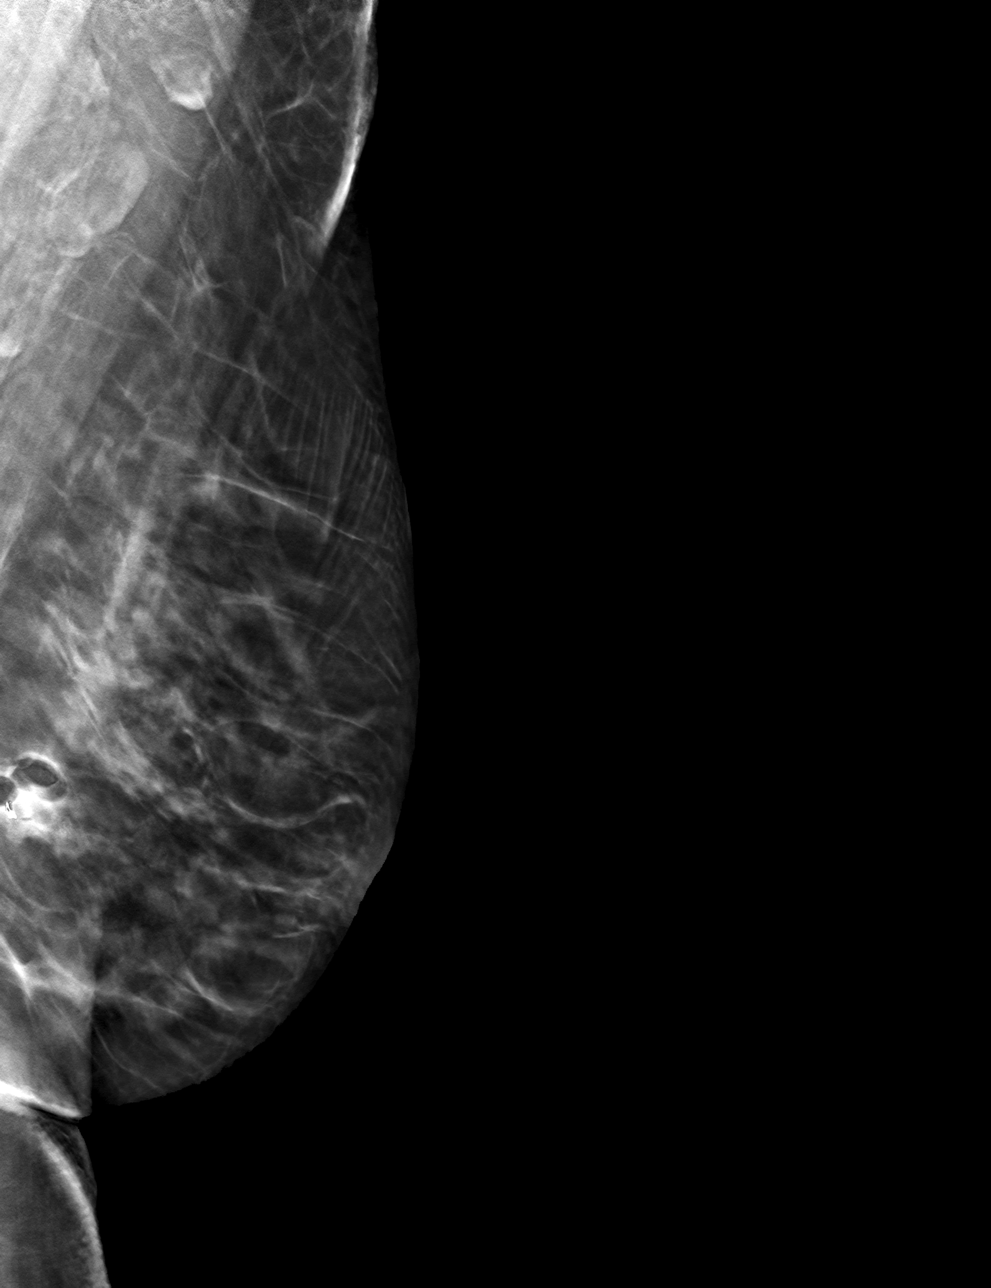

[4 of 12 positions shown; findings below may reference images not displayed]

FINDINGS: 3D Mammographic images were obtained following stereotactic guided
biopsy of left breast calcifications. The biopsy marking clip is in
expected position at the site of biopsy.
IMPRESSION: Appropriate positioning of the coil shaped biopsy marking clip at
the site of biopsy in the posterior left breast. There are
surrounding residual calcifications.

Final Assessment: Post Procedure Mammograms for Marker Placement

## 2024-01-30 ENCOUNTER — Other Ambulatory Visit: Payer: Self-pay | Admitting: Internal Medicine

## 2024-01-30 DIAGNOSIS — D0512 Intraductal carcinoma in situ of left breast: Secondary | ICD-10-CM

## 2024-01-30 DIAGNOSIS — E782 Mixed hyperlipidemia: Secondary | ICD-10-CM
